# Patient Record
Sex: Male | Born: 1980 | Race: White | Hispanic: No | Smoking: Never smoker
Health system: Southern US, Community
[De-identification: ages and names within clinical notes are randomized; demographics above are authoritative.]

---

## 2015-11-21 ENCOUNTER — Emergency Department (HOSPITAL_COMMUNITY): Payer: BLUE CROSS/BLUE SHIELD

## 2015-11-21 ENCOUNTER — Encounter (HOSPITAL_COMMUNITY): Payer: Self-pay | Admitting: *Deleted

## 2015-11-21 ENCOUNTER — Emergency Department (HOSPITAL_COMMUNITY)
Admission: EM | Admit: 2015-11-21 | Discharge: 2015-11-22 | Disposition: A | Discharge status: Home / Self Care | Payer: BLUE CROSS/BLUE SHIELD | Attending: Emergency Medicine | Admitting: Emergency Medicine

## 2015-11-21 DIAGNOSIS — S31111A Laceration without foreign body of abdominal wall, left upper quadrant without penetration into peritoneal cavity, initial encounter: Secondary | ICD-10-CM | POA: Insufficient documentation

## 2015-11-21 DIAGNOSIS — Y9389 Activity, other specified: Secondary | ICD-10-CM | POA: Insufficient documentation

## 2015-11-21 DIAGNOSIS — Y9289 Other specified places as the place of occurrence of the external cause: Secondary | ICD-10-CM | POA: Diagnosis not present

## 2015-11-21 DIAGNOSIS — Z79899 Other long term (current) drug therapy: Secondary | ICD-10-CM | POA: Insufficient documentation

## 2015-11-21 DIAGNOSIS — S31119A Laceration without foreign body of abdominal wall, unspecified quadrant without penetration into peritoneal cavity, initial encounter: Secondary | ICD-10-CM

## 2015-11-21 DIAGNOSIS — Y999 Unspecified external cause status: Secondary | ICD-10-CM | POA: Insufficient documentation

## 2015-11-21 DIAGNOSIS — S3991XA Unspecified injury of abdomen, initial encounter: Secondary | ICD-10-CM | POA: Diagnosis present

## 2015-11-21 LAB — PROTIME-INR
INR: 1.12 (ref 0.00–1.49)
Prothrombin Time: 14.6 seconds (ref 11.6–15.2)

## 2015-11-21 LAB — I-STAT CHEM 8, ED
BUN: 14 mg/dL (ref 6–20)
CALCIUM ION: 1.12 mmol/L (ref 1.12–1.23)
CHLORIDE: 103 mmol/L (ref 101–111)
Creatinine, Ser: 1.1 mg/dL (ref 0.61–1.24)
Glucose, Bld: 109 mg/dL — ABNORMAL HIGH (ref 65–99)
HCT: 57 % — ABNORMAL HIGH (ref 39.0–52.0)
Hemoglobin: 19.4 g/dL — ABNORMAL HIGH (ref 13.0–17.0)
POTASSIUM: 3.7 mmol/L (ref 3.5–5.1)
SODIUM: 142 mmol/L (ref 135–145)
TCO2: 25 mmol/L (ref 0–100)

## 2015-11-21 LAB — CBC
HCT: 51.6 % (ref 39.0–52.0)
HEMOGLOBIN: 17.3 g/dL — AB (ref 13.0–17.0)
MCH: 28.5 pg (ref 26.0–34.0)
MCHC: 33.5 g/dL (ref 30.0–36.0)
MCV: 85 fL (ref 78.0–100.0)
Platelets: 173 10*3/uL (ref 150–400)
RBC: 6.07 MIL/uL — AB (ref 4.22–5.81)
RDW: 13.7 % (ref 11.5–15.5)
WBC: 8.2 10*3/uL (ref 4.0–10.5)

## 2015-11-21 LAB — CDS SEROLOGY

## 2015-11-21 LAB — I-STAT TROPONIN, ED: TROPONIN I, POC: 0 ng/mL (ref 0.00–0.08)

## 2015-11-21 LAB — ETHANOL

## 2015-11-21 LAB — I-STAT CG4 LACTIC ACID, ED: LACTIC ACID, VENOUS: 1.98 mmol/L (ref 0.5–2.0)

## 2015-11-21 MED ORDER — LIDOCAINE-EPINEPHRINE 2 %-1:200000 IJ SOLN
10.0000 mL | Freq: Once | INTRAMUSCULAR | Status: AC
Start: 2015-11-21 — End: 2015-11-22
  Administered 2015-11-22: 10 mL
  Filled 2015-11-21: qty 20

## 2015-11-21 MED ORDER — TETANUS-DIPHTH-ACELL PERTUSSIS 5-2.5-18.5 LF-MCG/0.5 IM SUSP
0.5000 mL | Freq: Once | INTRAMUSCULAR | Status: DC
  Filled 2015-11-21: qty 0.5

## 2015-11-21 MED ORDER — IOHEXOL 300 MG/ML  SOLN
100.0000 mL | Freq: Once | INTRAMUSCULAR | Status: AC | PRN
  Administered 2015-11-21: 100 mL via INTRAVENOUS

## 2015-11-21 NOTE — Progress Notes (Signed)
°   11/21/15 2218  Advance Directives (For Healthcare)  Does patient have an advance directive? No  Would patient like information on creating an advanced directive? No - patient declined information  Provided spiritual support for pt.
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
## 2015-11-22 LAB — PREPARE FRESH FROZEN PLASMA
Unit division: 0
Unit division: 0

## 2015-11-22 LAB — TYPE AND SCREEN
ABO/RH(D): AB POS
ANTIBODY SCREEN: NEGATIVE
UNIT DIVISION: 0
Unit division: 0

## 2015-11-22 LAB — ABO/RH: ABO/RH(D): AB POS
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
